# Patient Record
Sex: Female | Born: 1980 | Race: White | Hispanic: No | Marital: Married
Health system: Southern US, Community
[De-identification: ages and names within clinical notes are randomized; demographics above are authoritative.]

---

## 2013-06-25 ENCOUNTER — Other Ambulatory Visit (HOSPITAL_COMMUNITY): Payer: Self-pay | Admitting: Maternal and Fetal Medicine

## 2013-06-25 DIAGNOSIS — IMO0001 Reserved for inherently not codable concepts without codable children: Secondary | ICD-10-CM

## 2013-07-09 ENCOUNTER — Encounter (HOSPITAL_COMMUNITY): Payer: Self-pay

## 2013-07-09 ENCOUNTER — Ambulatory Visit (HOSPITAL_COMMUNITY)
Admission: RE | Admit: 2013-07-09 | Discharge: 2013-07-09 | Disposition: A | Discharge status: Home / Self Care | Payer: 59 | Source: Ambulatory Visit | Attending: Maternal and Fetal Medicine | Admitting: Maternal and Fetal Medicine

## 2013-07-09 DIAGNOSIS — IMO0001 Reserved for inherently not codable concepts without codable children: Secondary | ICD-10-CM

## 2013-07-09 DIAGNOSIS — O30009 Twin pregnancy, unspecified number of placenta and unspecified number of amniotic sacs, unspecified trimester: Secondary | ICD-10-CM | POA: Insufficient documentation

## 2013-07-09 DIAGNOSIS — O30039 Twin pregnancy, monochorionic/diamniotic, unspecified trimester: Secondary | ICD-10-CM | POA: Insufficient documentation

## 2013-07-19 ENCOUNTER — Other Ambulatory Visit (HOSPITAL_COMMUNITY): Payer: Self-pay | Admitting: Maternal and Fetal Medicine

## 2013-07-19 DIAGNOSIS — O30009 Twin pregnancy, unspecified number of placenta and unspecified number of amniotic sacs, unspecified trimester: Secondary | ICD-10-CM

## 2013-07-23 ENCOUNTER — Encounter (HOSPITAL_COMMUNITY): Payer: Self-pay

## 2013-07-23 ENCOUNTER — Ambulatory Visit (HOSPITAL_COMMUNITY)
Admission: RE | Admit: 2013-07-23 | Discharge: 2013-07-23 | Disposition: A | Discharge status: Home / Self Care | Payer: 59 | Source: Ambulatory Visit | Attending: Maternal and Fetal Medicine | Admitting: Maternal and Fetal Medicine

## 2013-07-23 ENCOUNTER — Other Ambulatory Visit (HOSPITAL_COMMUNITY): Payer: Self-pay | Admitting: Maternal and Fetal Medicine

## 2013-07-23 DIAGNOSIS — O30039 Twin pregnancy, monochorionic/diamniotic, unspecified trimester: Secondary | ICD-10-CM | POA: Insufficient documentation

## 2013-07-23 DIAGNOSIS — O30009 Twin pregnancy, unspecified number of placenta and unspecified number of amniotic sacs, unspecified trimester: Secondary | ICD-10-CM | POA: Insufficient documentation

## 2013-07-23 DIAGNOSIS — IMO0001 Reserved for inherently not codable concepts without codable children: Secondary | ICD-10-CM | POA: Insufficient documentation

## 2014-02-17 ENCOUNTER — Encounter (HOSPITAL_COMMUNITY): Payer: Self-pay

## 2014-05-14 ENCOUNTER — Encounter (HOSPITAL_COMMUNITY): Payer: Self-pay | Admitting: *Deleted

## 2014-08-06 IMAGING — US US OB DETAIL+14 WK
1 series · 14 of 28 positions shown · non-contrast
Comparison: none

[Series 1: us ob detail+14 wk · 0.23mm/px · 14 of 83 slices shown]
[im 4/83]
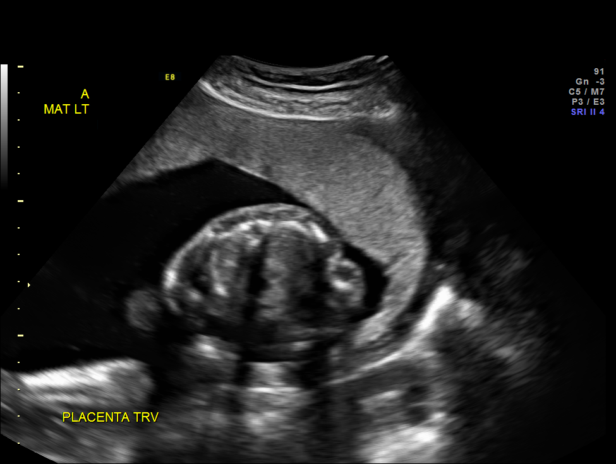
[im 10/83]
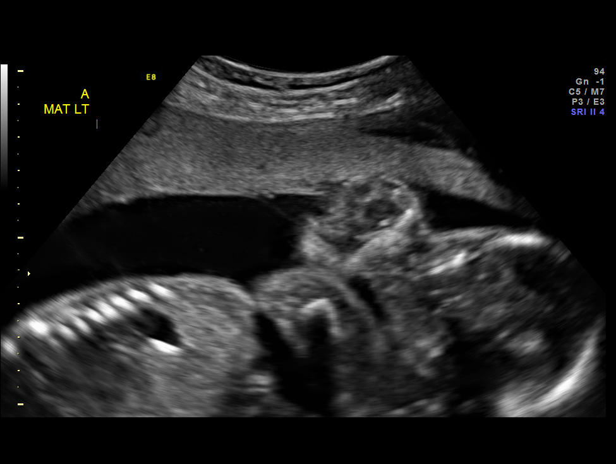
[im 16/83]
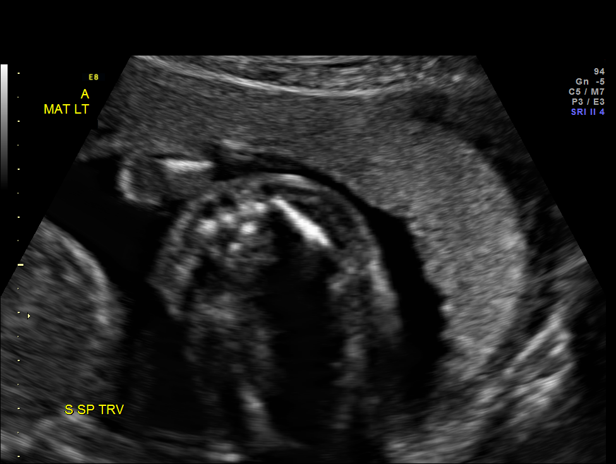
[im 22/83]
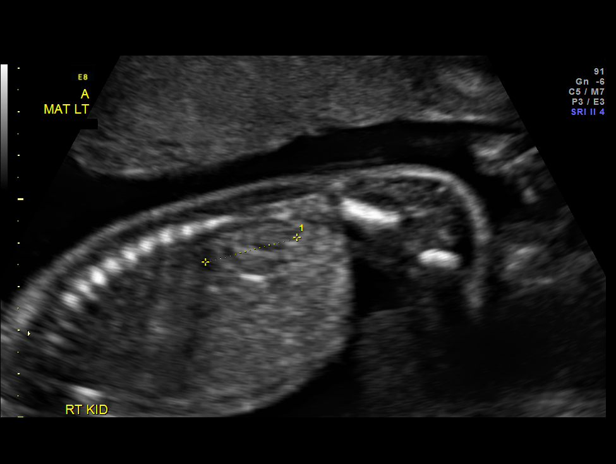
[im 28/83]
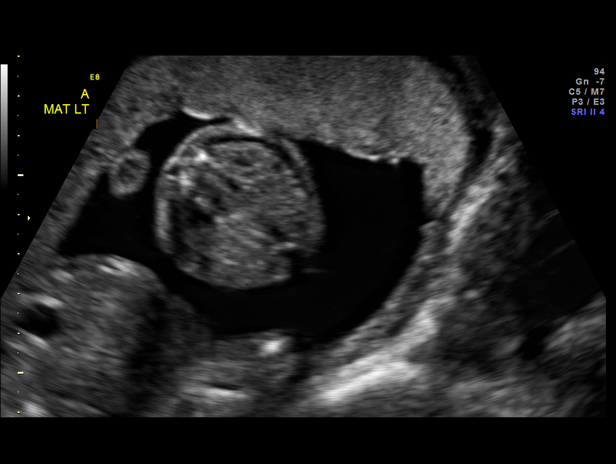
[im 34/83]
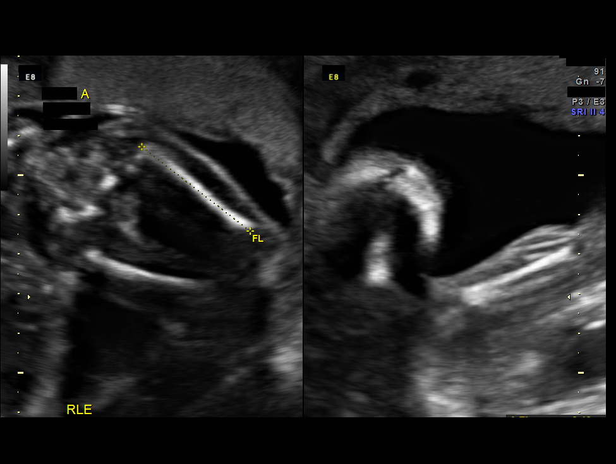
[im 40/83]
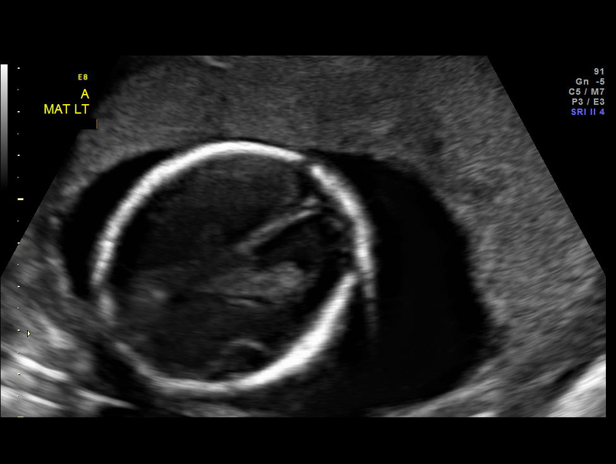
[im 46/83]
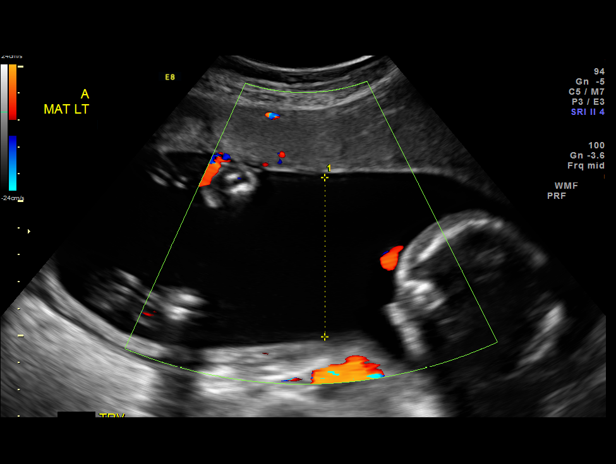
[im 52/83]
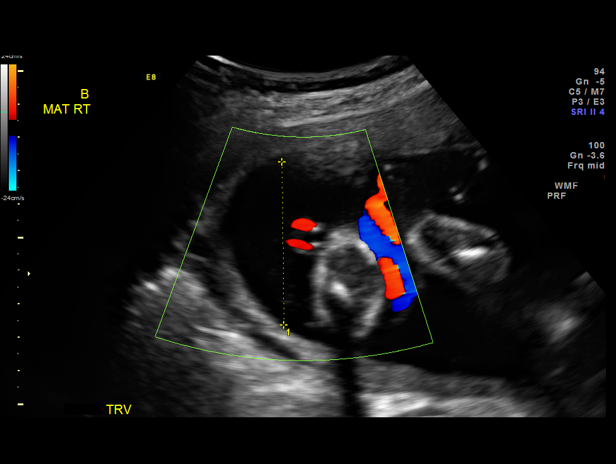
[im 58/83]
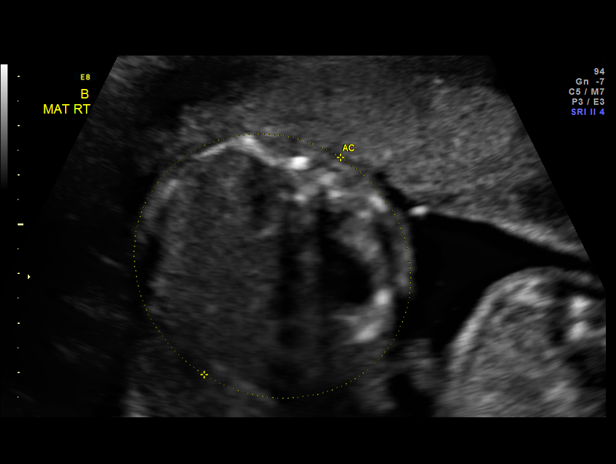
[im 64/83]
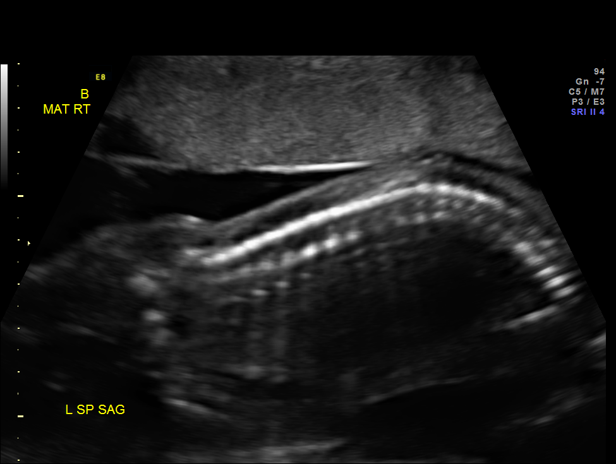
[im 70/83]
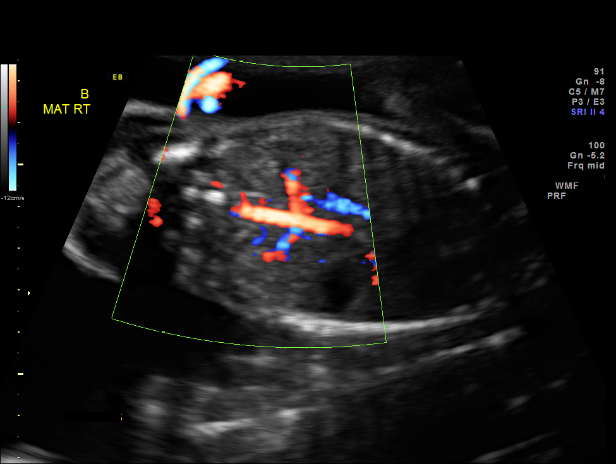
[im 76/83]
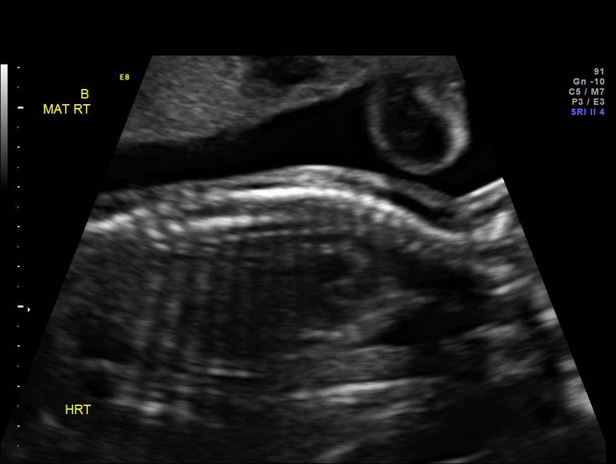
[im 83/83]
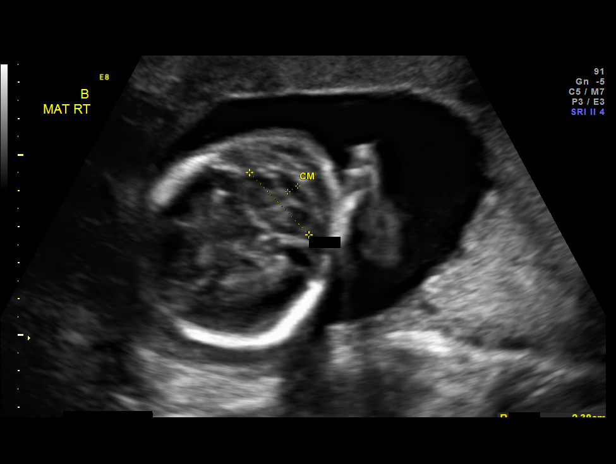

[14 of 28 positions shown; findings below may reference images not displayed]

OBSTETRICS REPORT
                      (Signed Final 07/23/2013 [DATE])

Service(s) Provided

 US OB DETAIL + 14 WK                                  76811.0
 US OB DETAIL ADDL GEST + 14 WK                        76811.1
Indications

 Twin gestation, Bernabe
 Velamentous insertion of umbilical cord - Twin A
Fetal Evaluation (Fetus A)

 Num Of Fetuses:    2
 Fetal Heart Rate:  139                          bpm
 Cardiac Activity:  Observed
 Fetal Lie:         Left Fetus
 Presentation:      Breech
 Placenta:          Anterior, above cervical os
 P. Cord            Velamentous ins prev
 Insertion:
                    seen

 Membrane Desc:     Dividing
                    Membrane seen
                    - Monochorionic

 Amniotic Fluid
 AFI FV:      Subjectively within normal limits
                                             Larg Pckt:     5.9  cm
Biometry (Fetus A)

 BPD:     54.1  mm     G. Age:  22w 3d                CI:         79.7   70 - 86
 OFD:     67.9  mm                                    FL/HC:      17.8   18.4 -

 HC:     196.5  mm     G. Age:  21w 6d       32  %    HC/AC:      1.13   1.06 -

 AC:     173.8  mm     G. Age:  22w 2d       53  %    FL/BPD:     64.5   71 - 87
 FL:      34.9  mm     G. Age:  21w 0d       13  %    FL/AC:      20.1   20 - 24
 HUM:     34.3  mm     G. Age:  21w 5d       42  %
 CER:       24  mm     G. Age:  22w 0d       50  %

 Est. FW:     450  gm           1 lb     39  %     FW Discordancy         1  %
Gestational Age (Fetus A)

 LMP:           22w 0d        Date:  02/19/13                 EDD:   11/26/13
 U/S Today:     21w 6d                                        EDD:   11/27/13
 Best:          22w 0d     Det. By:  LMP  (02/19/13)          EDD:   11/26/13
Anatomy (Fetus A)

 Cranium:          Appears normal         Aortic Arch:      Not well visualized
 Fetal Cavum:      Not well visualized    Ductal Arch:      Not well visualized
 Ventricles:       Appears normal         Diaphragm:        Appears normal
 Choroid Plexus:   Appears normal         Stomach:          Appears normal, left
                                                            sided
 Cerebellum:       Appears normal         Abdomen:          Appears normal
 Posterior Fossa:  Appears normal         Abdominal Wall:   Appears nml (cord
                                                            insert, abd wall)
 Nuchal Fold:      Not applicable (>20    Cord Vessels:     Appears normal (3
                   wks GA)                                  vessel cord)
 Face:             Orbits appear          Kidneys:          Appear normal
                   normal
 Lips:             Not well visualized    Bladder:          Appears normal
 Heart:            Not well visualized    Spine:            Appears normal
 RVOT:             Not well visualized    Lower             Appears normal
                                          Extremities:
 LVOT:             Not well visualized    Upper             Appears normal
                                          Extremities:

 Other:  Technically difficult due to fetal position.
Targeted Anatomy (Fetus A)

 Fetal Central Nervous System
 Cisterna Magna:

Fetal Evaluation (Fetus B)

 Num Of Fetuses:    2
 Fetal Heart Rate:  142                          bpm
 Cardiac Activity:  Observed
 Fetal Lie:         Right Fetus - presenting
 Presentation:      Cephalic
 Placenta:          Anterior, above cervical os
 P. Cord            Previously seen as normal
 Insertion:

 Membrane Desc:     Dividing
                    Membrane seen
                    - Monochorionic

 Amniotic Fluid
 AFI FV:      Subjectively within normal limits
                                             Larg Pckt:     4.9  cm
Biometry (Fetus B)

 BPD:     53.7  mm     G. Age:  22w 2d                CI:         82.5   70 - 86
 OFD:     65.1  mm                                    FL/HC:      19.2   18.4 -

 HC:     191.9  mm     G. Age:  21w 3d       19  %    HC/AC:      1.13   1.06 -

 AC:     169.1  mm     G. Age:  21w 6d       39  %    FL/BPD:     68.7   71 - 87
 FL:      36.9  mm     G. Age:  21w 5d       31  %    FL/AC:      21.8   20 - 24
 HUM:       34  mm     G. Age:  21w 4d       39  %
 CER:     23.8  mm     G. Age:  21w 6d       47  %
 Est. FW:     453  gm           1 lb     40  %     FW Discordancy      0 \ 1 %
Gestational Age (Fetus B)

 LMP:           22w 0d        Date:  02/19/13                 EDD:   11/26/13
 U/S Today:     21w 6d                                        EDD:   11/27/13
 Best:          22w 0d     Det. By:  LMP  (02/19/13)          EDD:   11/26/13
Anatomy (Fetus B)

 Cranium:          Appears normal         Aortic Arch:      Not well visualized
 Fetal Cavum:      Not well visualized    Ductal Arch:      Not well visualized
 Ventricles:       Appears normal         Diaphragm:        Not well visualized
 Choroid Plexus:   Appears normal         Stomach:          Appears normal, left
                                                            sided
 Cerebellum:       Appears normal         Abdomen:          Appears normal
 Posterior Fossa:  Appears normal         Abdominal Wall:   Appears nml (cord
                                                            insert, abd wall)
 Nuchal Fold:      Not applicable (>20    Cord Vessels:     Appears normal (3
                   wks GA)                                  vessel cord)
 Face:             Not well visualized    Kidneys:          Appear normal
 Lips:             Not well visualized    Bladder:          Appears normal
 Heart:            Not well visualized    Spine:            Appears normal
 RVOT:             Not well visualized    Lower             Appears normal
                                          Extremities:
 LVOT:             Not well visualized    Upper             Appears normal
                                          Extremities:

 Other:  Technically difficult due to fetal position.
Targeted Anatomy (Fetus B)

 Fetal Central Nervous System
 Cisterna Magna:
Cervix Uterus Adnexa

 Cervical Length:    3.1      cm

 Cervix:       Normal appearance by transabdominal scan.
 Left Ovary:    Within normal limits.
 Right Ovary:   Within normal limits.

 Adnexa:     No abnormality visualized.
Impression

 Monochorionic/diamniotic twin pregnancy at 22+0 weeks
 Normal but limited detailed fetal anatomy x 2; limited views of
 heart and face x 2
 Normal amniotic fluid volume x 2
 Measurements consistent with LMP dating with EFW at the
 39th and 40th %tiles
 Twin A with a velamentous cord insertion site
 No findings of TTTS
Recommendations

 Follow-up ultrasound in 2 weeks to reassess AFV
 Follow-up ultrasound for growth and AFV in 4 weeks
